# Patient Record
Sex: Male | Born: 1950 | Race: White | Hispanic: No | Marital: Married | Smoking: Former smoker
Health system: Southern US, Community
[De-identification: ages and names within clinical notes are randomized; demographics above are authoritative.]

## PROBLEM LIST (undated history)

## (undated) DIAGNOSIS — I10 Essential (primary) hypertension: Secondary | ICD-10-CM

## (undated) DIAGNOSIS — I714 Abdominal aortic aneurysm, without rupture, unspecified: Secondary | ICD-10-CM

## (undated) DIAGNOSIS — E785 Hyperlipidemia, unspecified: Secondary | ICD-10-CM

## (undated) HISTORY — PX: FOOT SURGERY: SHX648

---

## 2005-05-09 ENCOUNTER — Ambulatory Visit: Payer: Self-pay | Admitting: General Surgery

## 2005-05-27 ENCOUNTER — Ambulatory Visit: Payer: Self-pay | Admitting: Gastroenterology

## 2014-09-08 DIAGNOSIS — E559 Vitamin D deficiency, unspecified: Secondary | ICD-10-CM | POA: Insufficient documentation

## 2014-09-08 DIAGNOSIS — R7302 Impaired glucose tolerance (oral): Secondary | ICD-10-CM | POA: Insufficient documentation

## 2014-09-08 DIAGNOSIS — M79671 Pain in right foot: Secondary | ICD-10-CM | POA: Insufficient documentation

## 2014-09-08 DIAGNOSIS — N529 Male erectile dysfunction, unspecified: Secondary | ICD-10-CM | POA: Insufficient documentation

## 2014-09-08 DIAGNOSIS — E7800 Pure hypercholesterolemia, unspecified: Secondary | ICD-10-CM | POA: Insufficient documentation

## 2014-09-08 DIAGNOSIS — M79672 Pain in left foot: Secondary | ICD-10-CM

## 2016-07-18 ENCOUNTER — Ambulatory Visit (INDEPENDENT_AMBULATORY_CARE_PROVIDER_SITE_OTHER): Payer: BLUE CROSS/BLUE SHIELD

## 2016-07-18 ENCOUNTER — Encounter: Payer: Self-pay | Admitting: Podiatry

## 2016-07-18 ENCOUNTER — Ambulatory Visit (INDEPENDENT_AMBULATORY_CARE_PROVIDER_SITE_OTHER): Payer: BLUE CROSS/BLUE SHIELD | Admitting: Podiatry

## 2016-07-18 DIAGNOSIS — M79673 Pain in unspecified foot: Secondary | ICD-10-CM

## 2016-07-18 DIAGNOSIS — Q828 Other specified congenital malformations of skin: Secondary | ICD-10-CM | POA: Diagnosis not present

## 2016-07-18 DIAGNOSIS — M2012 Hallux valgus (acquired), left foot: Secondary | ICD-10-CM

## 2016-07-18 DIAGNOSIS — M6789 Other specified disorders of synovium and tendon, multiple sites: Secondary | ICD-10-CM | POA: Diagnosis not present

## 2016-07-18 DIAGNOSIS — M2042 Other hammer toe(s) (acquired), left foot: Secondary | ICD-10-CM | POA: Diagnosis not present

## 2016-07-18 DIAGNOSIS — M76829 Posterior tibial tendinitis, unspecified leg: Secondary | ICD-10-CM
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
## 2016-08-10 ENCOUNTER — Ambulatory Visit: Payer: BLUE CROSS/BLUE SHIELD

## 2016-08-26 ENCOUNTER — Telehealth: Payer: Self-pay | Admitting: *Deleted

## 2016-08-26 NOTE — Telephone Encounter (Signed)
Pt states he does not want to peruse MRI at this time, will call back next year. Informed Cari - Central Scheduling.

## 2017-12-01 ENCOUNTER — Other Ambulatory Visit
Admission: RE | Admit: 2017-12-01 | Discharge: 2017-12-01 | Disposition: A | Discharge status: Home / Self Care | Payer: BLUE CROSS/BLUE SHIELD | Source: Ambulatory Visit | Attending: *Deleted | Admitting: *Deleted

## 2017-12-01 ENCOUNTER — Encounter: Payer: Self-pay | Admitting: Emergency Medicine

## 2017-12-01 ENCOUNTER — Emergency Department
Admission: EM | Admit: 2017-12-01 | Discharge: 2017-12-01 | Disposition: A | Discharge status: Home / Self Care | Payer: BLUE CROSS/BLUE SHIELD | Attending: Emergency Medicine | Admitting: Emergency Medicine

## 2017-12-01 ENCOUNTER — Other Ambulatory Visit: Payer: Self-pay

## 2017-12-01 ENCOUNTER — Emergency Department: Payer: BLUE CROSS/BLUE SHIELD

## 2017-12-01 DIAGNOSIS — R791 Abnormal coagulation profile: Secondary | ICD-10-CM | POA: Insufficient documentation

## 2017-12-01 DIAGNOSIS — Z87891 Personal history of nicotine dependence: Secondary | ICD-10-CM | POA: Insufficient documentation

## 2017-12-01 DIAGNOSIS — R0600 Dyspnea, unspecified: Secondary | ICD-10-CM

## 2017-12-01 DIAGNOSIS — R2233 Localized swelling, mass and lump, upper limb, bilateral: Secondary | ICD-10-CM | POA: Insufficient documentation

## 2017-12-01 DIAGNOSIS — I10 Essential (primary) hypertension: Secondary | ICD-10-CM | POA: Insufficient documentation

## 2017-12-01 DIAGNOSIS — R600 Localized edema: Secondary | ICD-10-CM | POA: Insufficient documentation

## 2017-12-01 DIAGNOSIS — M25529 Pain in unspecified elbow: Secondary | ICD-10-CM | POA: Diagnosis not present

## 2017-12-01 DIAGNOSIS — R609 Edema, unspecified: Secondary | ICD-10-CM | POA: Diagnosis present

## 2017-12-01 DIAGNOSIS — Z79899 Other long term (current) drug therapy: Secondary | ICD-10-CM | POA: Diagnosis not present

## 2017-12-01 DIAGNOSIS — M25519 Pain in unspecified shoulder: Secondary | ICD-10-CM | POA: Diagnosis not present

## 2017-12-01 HISTORY — DX: Essential (primary) hypertension: I10

## 2017-12-01 HISTORY — DX: Hyperlipidemia, unspecified: E78.5

## 2017-12-01 LAB — TROPONIN I: Troponin I: <0.03 ng/mL (ref <0.03)

## 2017-12-01 LAB — BRAIN NATRIURETIC PEPTIDE: B NATRIURETIC PEPTIDE 5: 50 pg/mL (ref 0.0–100.0)

## 2017-12-01 LAB — FIBRIN DERIVATIVES D-DIMER (ARMC ONLY): Fibrin derivatives D-dimer (ARMC): 1499.1 ng/mL (FEU) — ABNORMAL HIGH (ref 0.00–499.00)

## 2017-12-01 MED ORDER — IOPAMIDOL (ISOVUE-370) INJECTION 76%
75.0000 mL | Freq: Once | INTRAVENOUS | Status: AC | PRN
  Administered 2017-12-01: 75 mL via INTRAVENOUS

## 2017-12-01 NOTE — ED Notes (Signed)
md into speak with pt

## 2017-12-01 NOTE — ED Provider Notes (Signed)
[facility] Emergency Department Provider Note  Time seen: 11:11 PM  I have reviewed the triage vital signs and the nursing notes.   HISTORY  Chief Complaint Leg Swelling and abnormal labs    HPI Javier Ramirez is a 67 y.o. male with a past medical history of hypertension, hyperlipidemia, went to his doctor today for increased peripheral edema including swelling in his hands, generalized joint pains and elevated blood pressure.  Patient was seen by his doctor had blood work performed, was discharged on prednisone in blood pressure medication.  He was called back later today saying that his d-dimer was elevated and that he needed to go to the emergency department for a CT scan of his chest to make sure he did not have a blood clot in his chest.  Patient denies any shortness of breath, denies any chest pain or pleuritic chest pain.  States he has had progressively worsening lower extremity edema over the past 1-2 weeks as well as edema and tenderness in his hands and pain in his joints especially shoulders and elbows.  Patient has been told that this is due to arthritis in the past.  Denies any known history of autoimmune disorders or rheumatoid arthritis.  Denies any fever.   Past Medical History:  Diagnosis Date  . Hyperlipemia   . Hypertension     Patient Active Problem List   Diagnosis Date Noted  . Bilateral foot pain 09/08/2014  . ED (erectile dysfunction) 09/08/2014  . Impaired glucose tolerance 09/08/2014  . Pure hypercholesterolemia 09/08/2014  . Vitamin D deficiency 09/08/2014     Prior to Admission medications   Medication Sig Start Date End Date Taking? Authorizing Provider  atorvastatin (LIPITOR) 10 MG tablet Take 10 mg by mouth daily.    Yes [provider]  Cholecalciferol (VITAMIN D3) 1000 units CAPS Take 1,000 Units by mouth daily.    Yes [provider]  ibuprofen (ADVIL,MOTRIN) 800 MG tablet Take 800 mg by mouth every 8  (eight) hours as needed for fever or moderate pain.  07/07/15  Yes [provider]  lisinopril-hydrochlorothiazide (PRINZIDE,ZESTORETIC) 20-12.5 MG tablet Take 1 tablet by mouth daily.   Yes [provider]  predniSONE (DELTASONE) 20 MG tablet Take 20 mg by mouth 2 (two) times daily.   Yes [provider]  sildenafil (REVATIO) 20 MG tablet Take 20 mg by mouth daily as needed.   Yes [provider]    No Known Allergies  No family history on file.  Social History Social History   Tobacco Use  . Smoking status: Former Games developer  . Smokeless tobacco: Never Used  Substance Use Topics  . Alcohol use: No    Frequency: Never  . Drug use: No    Review of Systems Constitutional: Negative for fever. Eyes: Negative for visual complaints ENT: Negative for recent illness/congestion Cardiovascular: Negative for chest pain. Respiratory: Negative for shortness of breath. Gastrointestinal: Negative for abdominal pain Genitourinary: Negative for urinary compaints Musculoskeletal: Positive for peripheral edema his lower extremities as well as hands.  Positive for joint pain especially in the shoulders and elbows. Skin: Negative for skin complaints  Neurological: Negative for headache All other ROS negative  ____________________________________________   PHYSICAL EXAM:  VITAL SIGNS: ED Triage Vitals [12/01/17 1850]  Enc Vitals Group     BP (!) 198/98     Pulse Rate 89     Resp 16     Temp 97.7 F (36.5 C)  Temp Source Oral     SpO2 97 %     Weight 231 lb (104.8 kg)     Height 5\' 9"  (1.753 m)     Head Circumference      Peak Flow      Pain Score 6     Pain Loc      Pain Edu?      Excl. in GC?    Constitutional: Alert and oriented. Well appearing and in no distress. Eyes: Normal exam ENT   Head: Normocephalic and atraumatic.   Mouth/Throat: Mucous membranes are moist. Cardiovascular: Normal rate, regular rhythm.  Respiratory: Normal  respiratory effort without tachypnea nor retractions. Breath sounds are clear  Gastrointestinal: Soft and nontender. No distention.   Musculoskeletal: Patient does have mild edema in both of his hands especially to the dorsal aspects, patient has tenderness and pain with range of motion mildly in bilateral shoulders and elbows.  States that has been going on for some time.  Patient has 1+ edema to his lower extremities, equal bilaterally without calf tenderness. Neurologic:  Normal speech and language. No gross focal neurologic deficits  Skin:  Skin is warm, dry and intact.  Psychiatric: Mood and affect are normal.   ____________________________________________   RADIOLOGY  CTA negative for PE.  Overall normal.  ____________________________________________   INITIAL IMPRESSION / ASSESSMENT AND PLAN / ED COURSE  Pertinent labs & imaging results that were available during my care of the patient were reviewed by me and considered in my medical decision making (see chart for details).  Patient presents the emergency department for an elevated d-dimer.  Differential would include inflammatory condition, PE.  We will obtain a CT scan as requested by the patient's physician.  Patient also states he saw his physician today for increased peripheral edema over the past 1 week.  Patient is noted to be quite hypertensive greater than 200/100 at his PCP office started on lisinopril/hydrochlorothiazide which I believe is an appropriate medication given his moderate edema and hypertension.  Should help reduce afterload as well as peripheral edema.  Has a follow-up appointment on Wednesday to recheck his pressures.  Patient was also started on prednisone which I believe is warranted given his hand edema to the dorsal aspect generalized joint pain not responsive to ibuprofen.  I discussed with the patient following up with his doctor to discuss possible autoimmune workup such as lupus or rheumatoid arthritis.   Patient CT scan is negative for blood clot.  Patient did not want the ultrasound as he said he was only sent for a CT scan.  I do not believe the patient requires an ultrasound given the very symmetrical peripheral edema.  No significant tenderness.  No shortness of breath, no chest pain.  We will discharge patient home with PCP follow-up.  Patient took his first dose of prednisone and lisinopril/hydrochlorthiazide in the emergency department.  ____________________________________________   FINAL CLINICAL IMPRESSION(S) / ED DIAGNOSES  Hypertension Peripheral edema    Minna AntisPaduchowski, Makela Niehoff, MD 12/01/17 2316

## 2017-12-01 NOTE — ED Triage Notes (Signed)
Pt to ED via POV, [facility] for bilateral UE and LE edema, pt had labs done and was told that his d-dimer was elevated and that he needed to come to ED for evaluation. Pt states that he has been taking a lot of ibuprofen and the doctor at walk in thought the swelling may be related to this and high blood pressure. Pt reports blood pressure at walk-in was 200/130, pt was given RX for blood pressure medication that is combined with a fluid pill and prednisone. Pt in NAD at this time.

## 2017-12-01 NOTE — ED Notes (Signed)
Approx this time Rosey Batheresa, US tech, informed this RN pt and family are refusing US and requesting CT

## 2017-12-01 NOTE — ED Notes (Signed)
Patient @ CT

## 2017-12-01 NOTE — ED Notes (Signed)
CT notified patient has IV placed.

## 2019-04-24 ENCOUNTER — Other Ambulatory Visit: Payer: Self-pay | Admitting: Internal Medicine

## 2019-04-24 DIAGNOSIS — G8929 Other chronic pain: Secondary | ICD-10-CM

## 2019-04-24 DIAGNOSIS — M5442 Lumbago with sciatica, left side: Secondary | ICD-10-CM

## 2019-05-03 ENCOUNTER — Ambulatory Visit
Admission: RE | Admit: 2019-05-03 | Discharge: 2019-05-03 | Disposition: A | Discharge status: Home / Self Care | Payer: BC Managed Care – PPO | Source: Ambulatory Visit | Attending: Internal Medicine | Admitting: Internal Medicine

## 2019-05-03 ENCOUNTER — Other Ambulatory Visit: Payer: Self-pay

## 2019-05-03 DIAGNOSIS — M5442 Lumbago with sciatica, left side: Secondary | ICD-10-CM | POA: Insufficient documentation

## 2019-05-03 DIAGNOSIS — G8929 Other chronic pain: Secondary | ICD-10-CM | POA: Insufficient documentation

## 2020-07-08 ENCOUNTER — Other Ambulatory Visit: Payer: Self-pay

## 2020-07-08 ENCOUNTER — Ambulatory Visit (INDEPENDENT_AMBULATORY_CARE_PROVIDER_SITE_OTHER): Payer: BC Managed Care – PPO

## 2020-07-08 ENCOUNTER — Ambulatory Visit
Admission: EM | Admit: 2020-07-08 | Discharge: 2020-07-08 | Disposition: A | Discharge status: Home / Self Care | Payer: BC Managed Care – PPO | Attending: Family Medicine | Admitting: Family Medicine

## 2020-07-08 DIAGNOSIS — U071 COVID-19: Secondary | ICD-10-CM | POA: Diagnosis not present

## 2020-07-08 DIAGNOSIS — I10 Essential (primary) hypertension: Secondary | ICD-10-CM | POA: Insufficient documentation

## 2020-07-08 DIAGNOSIS — J988 Other specified respiratory disorders: Secondary | ICD-10-CM | POA: Diagnosis not present

## 2020-07-08 DIAGNOSIS — R05 Cough: Secondary | ICD-10-CM | POA: Diagnosis present

## 2020-07-08 DIAGNOSIS — E785 Hyperlipidemia, unspecified: Secondary | ICD-10-CM | POA: Insufficient documentation

## 2020-07-08 DIAGNOSIS — Z7901 Long term (current) use of anticoagulants: Secondary | ICD-10-CM | POA: Insufficient documentation

## 2020-07-08 DIAGNOSIS — J069 Acute upper respiratory infection, unspecified: Secondary | ICD-10-CM | POA: Diagnosis not present

## 2020-07-08 DIAGNOSIS — R0981 Nasal congestion: Secondary | ICD-10-CM | POA: Diagnosis not present

## 2020-07-08 DIAGNOSIS — Z87891 Personal history of nicotine dependence: Secondary | ICD-10-CM | POA: Insufficient documentation

## 2020-07-08 DIAGNOSIS — E7800 Pure hypercholesterolemia, unspecified: Secondary | ICD-10-CM | POA: Diagnosis not present

## 2020-07-08 DIAGNOSIS — Z79899 Other long term (current) drug therapy: Secondary | ICD-10-CM | POA: Insufficient documentation

## 2020-07-08 DIAGNOSIS — B9789 Other viral agents as the cause of diseases classified elsewhere: Secondary | ICD-10-CM

## 2020-07-08 LAB — SARS CORONAVIRUS 2 (TAT 6-24 HRS): SARS Coronavirus 2: POSITIVE — AB

## 2020-07-08 MED ORDER — BENZONATATE 200 MG PO CAPS: 200.0000 mg | ORAL_CAPSULE | Freq: Three times a day (TID) | ORAL | 0 refills | Status: AC | PRN

## 2020-07-08 NOTE — ED Triage Notes (Signed)
Patient in today w/ c/o cough, sinus congestion and drainage, and chest congestion. Patient denies loss of taste or smell, B/A, H/A, or decreased appetite.

## 2020-07-08 NOTE — Discharge Instructions (Signed)
Rest.   Fluids.  Stay home.  Medications as prescribed.

## 2020-07-08 NOTE — ED Provider Notes (Signed)
[facility]    CSN: [id number] Arrival date & time: 07/08/20  1026      History   Chief Complaint Chief Complaint  Patient presents with  . Cough  . Nasal Congestion   HPI   69 year old male presents with the above complaints.  Patient reports that he has been sick for the past week.  Reports cough and congestion.  No fever.  Denies body aches.  Reports he feels.  No relieving factors.  Patient's wife is sick with symptoms as well.  Patient and his wife have not been vaccinating his COVID-19.  No other reported sick contacts.  No other associated symptoms.  No other complaints.  Past Medical History:  Diagnosis Date  . Hyperlipemia   . Hypertension     Patient Active Problem List   Diagnosis Date Noted  . Bilateral foot pain 09/08/2014  . ED (erectile dysfunction) 09/08/2014  . Impaired glucose tolerance 09/08/2014  . Pure hypercholesterolemia 09/08/2014  . Vitamin D deficiency 09/08/2014    Past Surgical History:  Procedure Laterality Date  . FOOT SURGERY Right        Home Medications    Prior to Admission medications   Medication Sig Start Date End Date Taking? Authorizing Provider  atorvastatin (LIPITOR) 10 MG tablet Take 10 mg by mouth daily.    Yes [provider]  Cholecalciferol (VITAMIN D3) 1000 units CAPS Take 1,000 Units by mouth daily.    Yes [provider]  HUMIRA PEN 40 MG/0.4ML PNKT SMARTSIG:40 Milligram(s) SUB-Q Every 2 Weeks 06/18/20  Yes [provider]  ibuprofen (ADVIL,MOTRIN) 800 MG tablet Take 800 mg by mouth every 8 (eight) hours as needed for fever or moderate pain.  07/07/15  Yes [provider]  lisinopril-hydrochlorothiazide (PRINZIDE,ZESTORETIC) 20-12.5 MG tablet Take 1 tablet by mouth daily.   Yes [provider]  methotrexate 2.5 MG tablet  06/29/20  Yes [provider]  sildenafil (REVATIO) 20 MG tablet Take 20 mg by mouth daily as needed.   Yes [provider]    benzonatate (TESSALON) 200 MG capsule Take 1 capsule (200 mg total) by mouth 3 (three) times daily as needed for cough. 07/08/20   Tommie Sams, DO    Social History Social History   Tobacco Use  . Smoking status: Former Games developer  . Smokeless tobacco: Never Used  Substance Use Topics  . Alcohol use: No  . Drug use: No     Allergies   Patient has no known allergies.   Review of Systems Review of Systems  Constitutional: Negative for fever.  HENT: Positive for congestion.   Respiratory: Positive for cough.    Physical Exam Triage Vital Signs ED Triage Vitals  Enc Vitals Group     BP 07/08/20 1109 130/74     Pulse Rate 07/08/20 1109 69     Resp 07/08/20 1109 18     Temp 07/08/20 1109 98 F (36.7 C)     Temp Source 07/08/20 1109 Oral     SpO2 07/08/20 1109 96 %     Weight 07/08/20 1110 218 lb (98.9 kg)     Height 07/08/20 1110 5\' 9"  (1.753 m)     Head Circumference --      Peak Flow --      Pain Score 07/08/20 1109 0     Pain Loc --      Pain Edu? --      Excl. in GC? --    Updated Vital  Signs BP 130/74 (BP Location: Right Arm)   Pulse 69   Temp 98 F (36.7 C) (Oral)   Resp 18   Ht 5\' 9"  (1.753 m)   Wt 98.9 kg   SpO2 96%   BMI 32.19 kg/m   Visual Acuity Right Eye Distance:   Left Eye Distance:   Bilateral Distance:    Right Eye Near:   Left Eye Near:    Bilateral Near:     Physical Exam Vitals and nursing note reviewed.  Constitutional:      General: He is not in acute distress.    Appearance: Normal appearance. He is not ill-appearing.  HENT:     Head: Normocephalic and atraumatic.  Eyes:     General:        Right eye: No discharge.        Left eye: No discharge.     Conjunctiva/sclera: Conjunctivae normal.  Cardiovascular:     Rate and Rhythm: Normal rate and regular rhythm.     Heart sounds: No murmur heard.   Pulmonary:     Effort: Pulmonary effort is normal.     Breath sounds: Normal breath sounds. No wheezing or rales.   Neurological:     Mental Status: He is alert.  Psychiatric:        Mood and Affect: Mood normal.        Behavior: Behavior normal.    UC Treatments / Results  Labs (all labs ordered are listed, but only abnormal results are displayed) Labs Reviewed  SARS CORONAVIRUS 2 (TAT 6-24 HRS)    EKG   Radiology DG Chest 2 View  Result Date: 07/08/2020 CLINICAL DATA:  Cough EXAM: CHEST - 2 VIEW COMPARISON:  None. FINDINGS: The heart size and mediastinal contours are within normal limits. Both lungs are clear. The visualized skeletal structures are unremarkable. IMPRESSION: No acute abnormality of the lungs. Electronically Signed   By: 09/07/2020 M.D.   On: 07/08/2020 12:08    Procedures Procedures (including critical care time)  Medications Ordered in UC Medications - No data to display  Initial Impression / Assessment and Plan / UC Course  I have reviewed the triage vital signs and the nursing notes.  Pertinent labs & imaging results that were available during my care of the patient were reviewed by me and considered in my medical decision making (see chart for details).    70 year old male presents with a viral respiratory infection.  Suspected COVID-19.  Awaiting test result.  Chest x-ray obtained and independently reviewed by me.  Interpretation: No acute infiltrate.  Normal chest film.  Advised rest and fluids and supportive care.  Tessalon Perles for cough.  If Covid test is positive, recommend monoclonal antibody infusion.  Final Clinical Impressions(s) / UC Diagnoses   Final diagnoses:  Viral respiratory infection     Discharge Instructions     Rest.   Fluids.  Stay home.  Medications as prescribed.    ED Prescriptions    Medication Sig Dispense Auth. Provider   benzonatate (TESSALON) 200 MG capsule Take 1 capsule (200 mg total) by mouth 3 (three) times daily as needed for cough. 30 capsule 78, DO     PDMP not reviewed this encounter.   Tommie Sams, DO 07/08/20 1226

## 2020-07-09 ENCOUNTER — Telehealth: Payer: Self-pay | Admitting: Oncology

## 2020-07-09 ENCOUNTER — Other Ambulatory Visit: Payer: Self-pay | Admitting: Oncology

## 2020-07-09 ENCOUNTER — Telehealth: Payer: Self-pay | Admitting: Family

## 2020-07-09 DIAGNOSIS — U071 COVID-19: Secondary | ICD-10-CM

## 2020-07-09 NOTE — Telephone Encounter (Signed)
I connected by phone with  Mr. Rutigliano  to discuss the potential use of an new treatment for mild to moderate COVID-19 viral infection in non-hospitalized patients.   This patient is a age/sex that meets the FDA criteria for Emergency Use Authorization of casirivimab\imdevimab.  Has a (+) direct SARS-CoV-2 viral test result 1. Has mild or moderate COVID-19  2. Is ? 70 years of age and weighs ? 40 kg 3. Is NOT hospitalized due to COVID-19 4. Is NOT requiring oxygen therapy or requiring an increase in baseline oxygen flow rate due to COVID-19 5. Is within 10 days of symptom onset 6. Has at least one of the high risk factor(s) for progression to severe COVID-19 and/or hospitalization as defined in EUA. ? Specific high risk criteria : age   Symptom onset 07/03/20   I have spoken and communicated the following to the patient or parent/caregiver:   1. FDA has authorized the emergency use of casirivimab\imdevimab for the treatment of mild to moderate COVID-19 in adults and pediatric patients with positive results of direct SARS-CoV-2 viral testing who are 63 years of age and older weighing at least 40 kg, and who are at high risk for progressing to severe COVID-19 and/or hospitalization.   2. The significant known and potential risks and benefits of casirivimab\imdevimab, and the extent to which such potential risks and benefits are unknown.   3. Information on available alternative treatments and the risks and benefits of those alternatives, including clinical trials.   4. Patients treated with casirivimab\imdevimab should continue to self-isolate and use infection control measures (e.g., wear mask, isolate, social distance, avoid sharing personal items, clean and disinfect "high touch" surfaces, and frequent handwashing) according to CDC guidelines.    5. The patient or parent/caregiver has the option to accept or refuse casirivimab\imdevimab .   After reviewing this information with the  patient, The patient agreed to proceed with receiving casirivimab\imdevimab infusion and will be provided a copy of the Fact sheet prior to receiving the infusion.Mignon Pine, AGNP-C [phone] (Infusion Center Hotline)

## 2020-07-09 NOTE — Telephone Encounter (Signed)
Called to Discuss with patient about Covid symptoms and the use of the monoclonal antibody infusion for those with mild to moderate Covid symptoms and at a high risk of hospitalization.     Pt appears to qualify for this infusion due to co-morbid conditions and/or a member of an at-risk group in accordance with the FDA Emergency Use Authorization.    Unable to reach Mr. Stockham who was recently seen at [facility] with cough and congestion and found to have positive COVID testing. Risk factors include BMI and hypertension.   Left message on voicemail. No MyChart available.   Marcos Eke, NP 07/09/2020 4:09 PM

## 2020-07-10 ENCOUNTER — Ambulatory Visit (HOSPITAL_COMMUNITY)
Admission: RE | Admit: 2020-07-10 | Discharge: 2020-07-10 | Disposition: A | Discharge status: Home / Self Care | Payer: BC Managed Care – PPO | Source: Ambulatory Visit | Attending: Pulmonary Disease | Admitting: Pulmonary Disease

## 2020-07-10 DIAGNOSIS — U071 COVID-19: Secondary | ICD-10-CM | POA: Diagnosis not present

## 2020-07-10 MED ORDER — DIPHENHYDRAMINE HCL 50 MG/ML IJ SOLN: 50.0000 mg | Freq: Once | INTRAMUSCULAR | Status: DC | PRN

## 2020-07-10 MED ORDER — EPINEPHRINE 0.3 MG/0.3ML IJ SOAJ: 0.3000 mg | Freq: Once | INTRAMUSCULAR | Status: DC | PRN

## 2020-07-10 MED ORDER — METHYLPREDNISOLONE SODIUM SUCC 125 MG IJ SOLR: 125.0000 mg | Freq: Once | INTRAMUSCULAR | Status: DC | PRN

## 2020-07-10 MED ORDER — ALBUTEROL SULFATE HFA 108 (90 BASE) MCG/ACT IN AERS: 2.0000 | INHALATION_SPRAY | Freq: Once | RESPIRATORY_TRACT | Status: DC | PRN

## 2020-07-10 MED ORDER — SODIUM CHLORIDE 0.9 % IV SOLN: INTRAVENOUS | Status: DC | PRN

## 2020-07-10 MED ORDER — FAMOTIDINE IN NACL 20-0.9 MG/50ML-% IV SOLN: 20.0000 mg | Freq: Once | INTRAVENOUS | Status: DC | PRN

## 2020-07-10 MED ORDER — CASIRIVIMAB-IMDEVIMAB 600-600 MG/10ML IJ SOLN
1200.0000 mg | Freq: Once | INTRAMUSCULAR | Status: AC
  Administered 2020-07-10: 1200 mg via INTRAVENOUS
  Filled 2020-07-10: qty 10

## 2020-07-10 NOTE — Discharge Instructions (Signed)

## 2020-07-10 NOTE — Progress Notes (Signed)
  Diagnosis: COVID-19  Physician: Dr. Patrick Wright  Procedure: Covid Infusion Clinic Med: casirivimab\imdevimab infusion - Provided patient with casirivimab\imdevimab fact sheet for patients, parents and caregivers prior to infusion.  Complications: No immediate complications noted.  Discharge: Discharged home   Ally Yow 07/10/2020   

## 2021-03-29 ENCOUNTER — Other Ambulatory Visit: Payer: Self-pay

## 2021-03-29 ENCOUNTER — Other Ambulatory Visit: Payer: Self-pay | Admitting: Family Medicine

## 2021-03-29 ENCOUNTER — Ambulatory Visit
Admission: RE | Admit: 2021-03-29 | Discharge: 2021-03-29 | Disposition: A | Discharge status: Home / Self Care | Payer: BC Managed Care – PPO | Source: Ambulatory Visit | Attending: Family Medicine | Admitting: Family Medicine

## 2021-03-29 DIAGNOSIS — M7989 Other specified soft tissue disorders: Secondary | ICD-10-CM | POA: Insufficient documentation

## 2021-03-29 DIAGNOSIS — M79661 Pain in right lower leg: Secondary | ICD-10-CM

## 2022-04-28 IMAGING — CR DG CHEST 2V
2 series · 2 of 2 positions shown · non-contrast
Comparison: None.

CLINICAL DATA: Cough

EXAM:
CHEST - 2 VIEW

[chest pa]
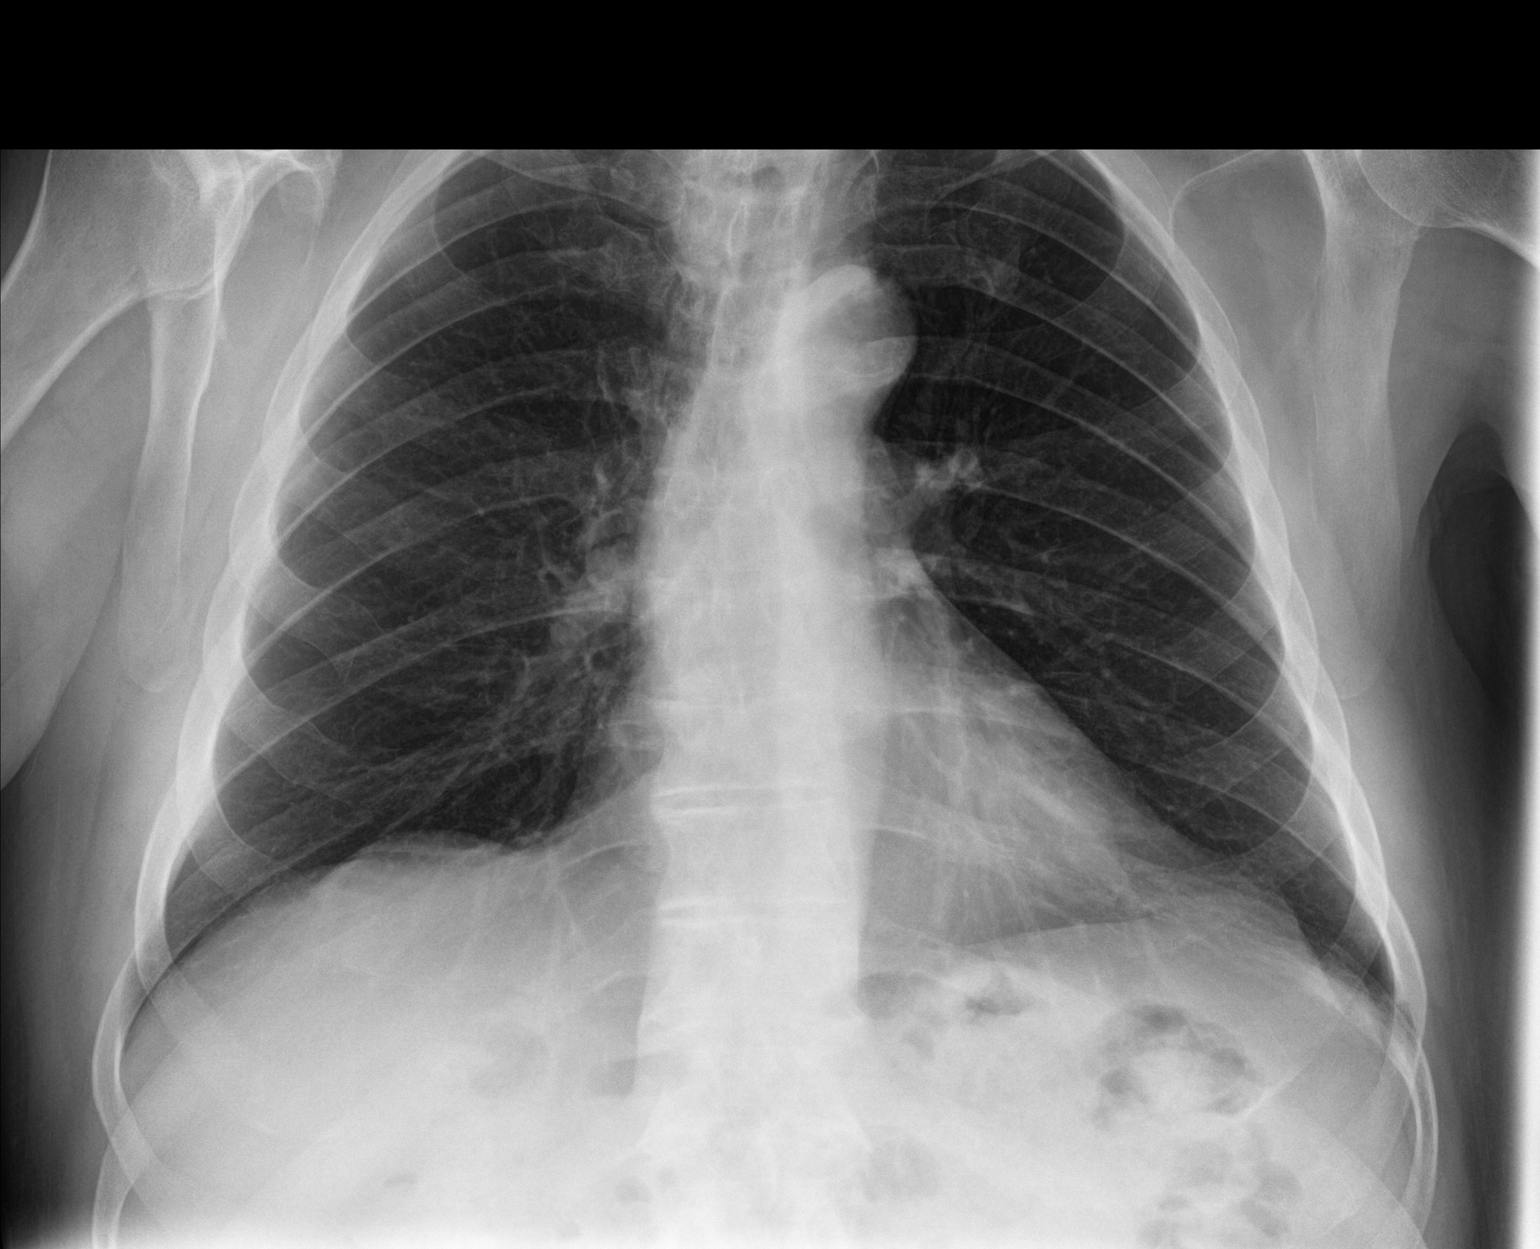

[chest lat]
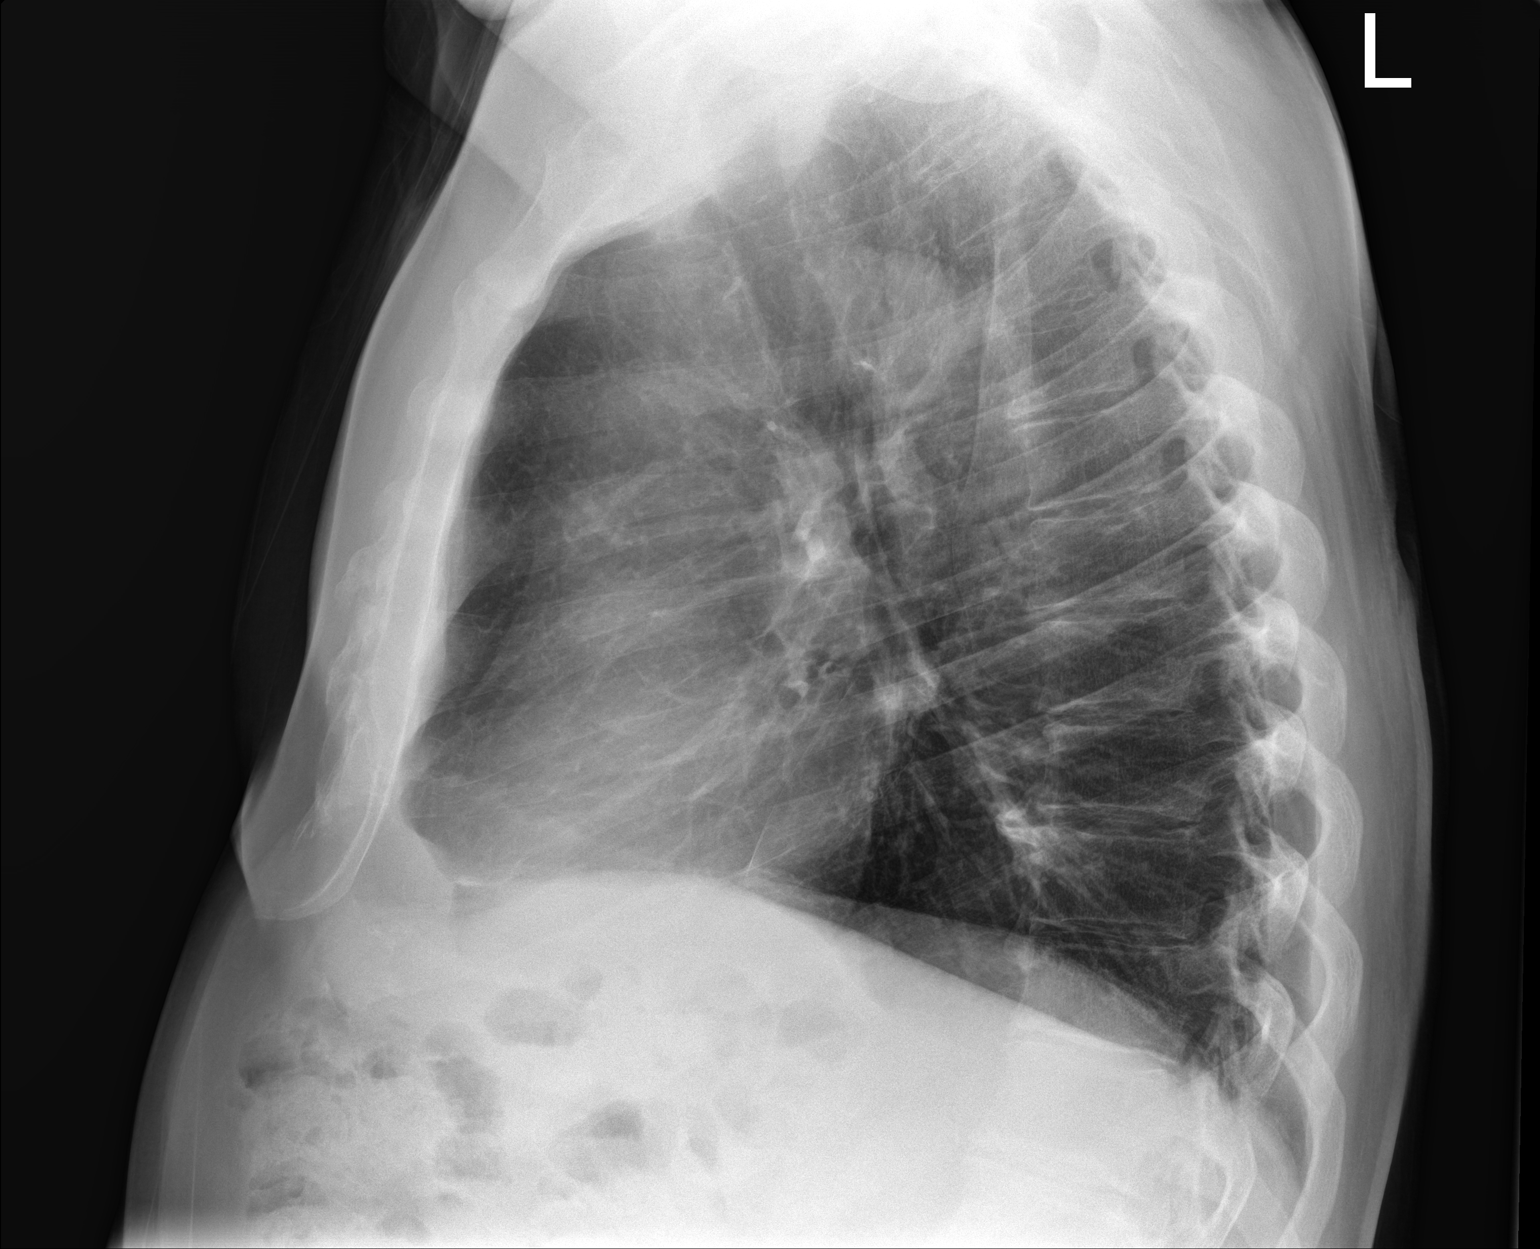

[2 of 2 positions shown; findings below may reference images not displayed]

FINDINGS: The heart size and mediastinal contours are within normal limits.
Both lungs are clear. The visualized skeletal structures are
unremarkable.
IMPRESSION: No acute abnormality of the lungs.

## 2022-05-05 ENCOUNTER — Other Ambulatory Visit: Payer: Self-pay | Admitting: Rheumatology

## 2022-05-05 DIAGNOSIS — Z8616 Personal history of COVID-19: Secondary | ICD-10-CM

## 2022-05-05 DIAGNOSIS — M059 Rheumatoid arthritis with rheumatoid factor, unspecified: Secondary | ICD-10-CM

## 2022-05-13 ENCOUNTER — Ambulatory Visit
Admission: RE | Admit: 2022-05-13 | Discharge: 2022-05-13 | Disposition: A | Discharge status: Home / Self Care | Payer: Medicare Other | Source: Ambulatory Visit | Attending: Rheumatology | Admitting: Rheumatology

## 2022-05-13 DIAGNOSIS — Z8616 Personal history of COVID-19: Secondary | ICD-10-CM

## 2022-05-13 DIAGNOSIS — M059 Rheumatoid arthritis with rheumatoid factor, unspecified: Secondary | ICD-10-CM

## 2022-09-26 ENCOUNTER — Ambulatory Visit: Payer: Medicare Other | Attending: Otolaryngology | Admitting: Speech Pathology

## 2022-09-26 ENCOUNTER — Encounter: Payer: Self-pay | Admitting: Speech Pathology

## 2022-09-26 DIAGNOSIS — R490 Dysphonia: Secondary | ICD-10-CM | POA: Diagnosis not present

## 2022-09-26 NOTE — Therapy (Signed)
OUTPATIENT SPEECH LANGUAGE PATHOLOGY VOICE EVALUATION   Patient Name: Javier Ramirez MRN: [id number] DOB:Mar 20, 1951, 71 y.o., male Today's Date: 09/26/2022  PCP: Maudie Flakes, MD REFERRING PROVIDER: Vernie Murders, MD   End of Session - 09/26/22 1255     Visit Number 1    Number of Visits 17    Date for SLP Re-Evaluation 11/21/22    Authorization Type Medicare A/ Medicare B    Progress Note Due on Visit 10    SLP Start Time 1300    SLP Stop Time  1400    SLP Time Calculation (min) 60 min    Activity Tolerance Patient tolerated treatment well             Past Medical History:  Diagnosis Date   Hyperlipemia    Hypertension    Past Surgical History:  Procedure Laterality Date   FOOT SURGERY Right    Patient Active Problem List   Diagnosis Date Noted   Bilateral foot pain 09/08/2014   ED (erectile dysfunction) 09/08/2014   Impaired glucose tolerance 09/08/2014   Pure hypercholesterolemia 09/08/2014   Vitamin D deficiency 09/08/2014    Onset date: 09/06/2022 date of referral ; chronic for 3 years Dysphonia  REFERRING DIAG: Dysphonia  THERAPY DIAG:  Dysphonia  Rationale for Evaluation and Treatment Rehabilitation  SUBJECTIVE:   SUBJECTIVE STATEMENT: Pt pleasant, eager, motivated, wife provides most of pt's history Pt accompanied by: significant other  PERTINENT HISTORY:  Pt is a 71 year old male who has been seen by pulmonology, cardiology, rhemotology and ENT for chronic cough x 3 years, more noticeable post COVID 2000. Pt's chest CT was negative, pt has completed Medrol dose pack and has an Albutrol Inhaler to use prn for coughing. He was also taken off of his ace inhibitor with no resolution of pt's coughing.  DIAGNOSTIC FINDINGS:   Most recently (09/06/2022)  pt was evaluated by ENT (Dr Vernie Murders).  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 No/   FALLS: Has patient fallen in last 6 months? No,  Number of falls: N/A  LIVING ENVIRONMENT: Lives with: lives with their spouse Lives in: House/apartment  PLOF: Independent  PATIENT GOALS to get rid of his cough  OBJECTIVE:   COGNITION: Overall cognitive status: Within functional limits for tasks assessed  SOCIAL HISTORY: Occupation: retired < 1 year Water intake: suboptimal Caffeine/alcohol intake: moderate Daily voice use: minimal Environmental risks: None reported Occupational risks: None identified Misuse: Excessively high pitch, Glottal fry, Strain, Tension, Speaks without adequate warm-up, Speaks without adequate breath support, and Speaks on residual capacity Phonotraumatic behaviors: Excessive and/or habitual throat clearing  PERCEPTUAL VOICE ASSESSMENT: Voice quality: hoarse, breathy, strained, low vocal intensity, diplophonia, and vocal fatigue Vocal abuse: habitual throat clearing, abnormal breathing pattern, and habitual abnormal pitch Resonance: normal Respiratory function: speaking on residual capacity  OBJECTIVE VOICE ASSESSMENT: Sustained "ah" maximum phonation time: 9 seconds Sustained "ah" loudness average: 74 dB Average fundamental frequency during sustained "ah":191 Hz   (2 SD above average of  145 Hz +/- 23 for gender)  Oral reading (passage) loudness average: 69 dB Oral reading loudness range: 12 dB Conversational pitch average: 183 Hz Highest dynamic pitch in conversational speech: 205 Hz Lowest dynamic pitch in conversational speech: 151 Hz Conversational pitch range: 54 Hz Conversational loudness average: 67 dB Conversational loudness range: 12 dB S/z ratio: .79 (Suggestive of dysfunction >1.0) Voice quality: hoarse, strained, low vocal intensity, and diplophonia  ORAL MOTOR EXAMINATION Facial : WFL Lingual: WFL Velum: WFL Mandible: WFL Cough: WFL Voice: Hoarse, Strained, Breathy    PATIENT REPORTED OUTCOME MEASURES (PROM):  VOICE HANDICAP INDEX (VHI)  The Voice Handicap  Index is comprised of a series of questions to assess the patient's perception of their voice. It is designed to evaluate the emotional, physical and functional components of the voice problem.  Functional: 19 Physical: 18 Emotional: 10 Total: 30 (Normal mean 8.75, SD =14.97)  z score =  1.41 mild = 1.01-1.99  TODAY'S TREATMENT:   N/A   PATIENT EDUCATION: Education details: results of this assessment and ST POC Person educated: Patient and Spouse Education method: Explanation Education comprehension: verbalized understanding and needs further education   HOME EXERCISE PROGRAM: Increase hydration     GOALS: Goals reviewed with patient? Yes  SHORT TERM GOALS: Target date: 10 sessions  The patient will maximize voice quality and loudness using breath support/oral resonance for sustained vowel production, pitch glides, and hierarchal speech drill. Baseline: Goal status: INITIAL  2.  The patient will eliminate phonotraumatic behaviors such as chronic throat clearing, by substituting non-traumatic methods to clear mucus. Baseline:  Goal status: INITIAL   LONG TERM GOALS: Target date: 11/21/2022  Patient will report improved communication effectiveness as measured by Communicative Effectiveness Survey.  Baseline:  Goal status: INITIAL  2.  The patient will demonstrate independent understanding of vocal hygiene concepts. Baseline:  Goal status: INITIAL  ASSESSMENT:  CLINICAL IMPRESSION: Patient is a 71 y.o. male who was seen today for a qualitative voice evaluation.    OBJECTIVE IMPAIRMENTS include voice disorder. These impairments are limiting patient from effectively communicating at home and in community. Factors affecting potential to achieve goals and functional outcome are  ~ 3 year chronic nature of deficits .Marland Kitchen Patient will benefit from skilled SLP services to address above impairments and improve overall function.  REHAB POTENTIAL: Good  PLAN: SLP  FREQUENCY: 1-2x/week  SLP DURATION: 8 weeks  PLANNED INTERVENTIONS: SLP instruction and feedback and Patient/family education    Ellaina Schuler B. Dreama Saa, M.S., CCC-SLP, Tree surgeon Certified Brain Injury Specialist [facility]  [facility] Office [phone] Ascom [phone] Fax [phone]

## 2022-10-03 ENCOUNTER — Ambulatory Visit: Payer: Medicare Other | Admitting: Speech Pathology

## 2022-10-06 ENCOUNTER — Ambulatory Visit: Payer: Medicare Other | Admitting: Speech Pathology

## 2022-10-11 ENCOUNTER — Ambulatory Visit: Payer: Medicare Other | Admitting: Speech Pathology

## 2022-10-14 ENCOUNTER — Ambulatory Visit: Payer: Medicare Other | Admitting: Speech Pathology

## 2022-10-18 ENCOUNTER — Ambulatory Visit: Payer: Medicare Other | Admitting: Speech Pathology

## 2022-10-20 ENCOUNTER — Encounter: Payer: Medicare Other | Admitting: Speech Pathology

## 2022-10-25 ENCOUNTER — Encounter: Payer: Medicare Other | Admitting: Speech Pathology

## 2022-10-27 ENCOUNTER — Encounter: Payer: Medicare Other | Admitting: Speech Pathology

## 2022-11-03 ENCOUNTER — Encounter: Payer: Medicare Other | Admitting: Speech Pathology

## 2022-11-08 ENCOUNTER — Encounter: Payer: Medicare Other | Admitting: Speech Pathology

## 2022-11-15 ENCOUNTER — Encounter: Payer: Medicare Other | Admitting: Speech Pathology

## 2022-11-17 ENCOUNTER — Encounter: Payer: Medicare Other | Admitting: Speech Pathology

## 2022-11-22 ENCOUNTER — Encounter: Payer: Medicare Other | Admitting: Speech Pathology

## 2022-11-24 ENCOUNTER — Encounter: Payer: Medicare Other | Admitting: Speech Pathology

## 2022-11-29 ENCOUNTER — Encounter: Payer: Medicare Other | Admitting: Speech Pathology

## 2022-11-29 ENCOUNTER — Other Ambulatory Visit: Payer: Self-pay

## 2022-11-29 DIAGNOSIS — I7121 Aneurysm of the ascending aorta, without rupture: Secondary | ICD-10-CM

## 2022-12-01 ENCOUNTER — Encounter: Payer: Medicare Other | Admitting: Speech Pathology

## 2022-12-06 ENCOUNTER — Encounter: Payer: Medicare Other | Admitting: Speech Pathology

## 2022-12-07 ENCOUNTER — Other Ambulatory Visit: Payer: Self-pay | Admitting: Family Medicine

## 2022-12-07 DIAGNOSIS — I7121 Aneurysm of the ascending aorta, without rupture: Secondary | ICD-10-CM

## 2022-12-08 ENCOUNTER — Encounter: Payer: Medicare Other | Admitting: Speech Pathology

## 2022-12-13 ENCOUNTER — Encounter: Payer: Medicare Other | Admitting: Speech Pathology

## 2022-12-15 ENCOUNTER — Ambulatory Visit
Admission: RE | Admit: 2022-12-15 | Discharge: 2022-12-15 | Disposition: A | Discharge status: Home / Self Care | Payer: Medicare Other | Source: Ambulatory Visit | Attending: Family Medicine | Admitting: Family Medicine

## 2022-12-15 ENCOUNTER — Encounter: Payer: Medicare Other | Admitting: Speech Pathology

## 2022-12-15 DIAGNOSIS — I7121 Aneurysm of the ascending aorta, without rupture: Secondary | ICD-10-CM

## 2022-12-15 MED ORDER — IOPAMIDOL (ISOVUE-370) INJECTION 76%
75.0000 mL | Freq: Once | INTRAVENOUS | Status: AC | PRN
  Administered 2022-12-15: 75 mL via INTRAVENOUS

## 2022-12-20 ENCOUNTER — Encounter: Payer: Medicare Other | Admitting: Speech Pathology

## 2022-12-22 ENCOUNTER — Encounter: Payer: Medicare Other | Admitting: Speech Pathology

## 2022-12-27 ENCOUNTER — Encounter: Payer: Medicare Other | Admitting: Speech Pathology

## 2022-12-29 ENCOUNTER — Encounter: Payer: Medicare Other | Admitting: Speech Pathology

## 2023-01-03 ENCOUNTER — Encounter: Payer: Medicare Other | Admitting: Speech Pathology

## 2023-01-05 ENCOUNTER — Encounter: Payer: Medicare Other | Admitting: Speech Pathology

## 2023-01-10 ENCOUNTER — Encounter: Payer: Medicare Other | Admitting: Speech Pathology

## 2023-01-12 ENCOUNTER — Encounter: Payer: Medicare Other | Admitting: Speech Pathology

## 2023-01-17 ENCOUNTER — Encounter: Payer: Medicare Other | Admitting: Speech Pathology

## 2023-01-17 IMAGING — US US EXTREM LOW VENOUS*R*
1 series · 13 of 24 positions shown · non-contrast
Comparison: None.

CLINICAL DATA: Right leg pain and swelling for 2 days



[Series 1: us venous img lower uni right (dvt) · portal-venous · 13 of 35 slices shown]
[im 1/35]
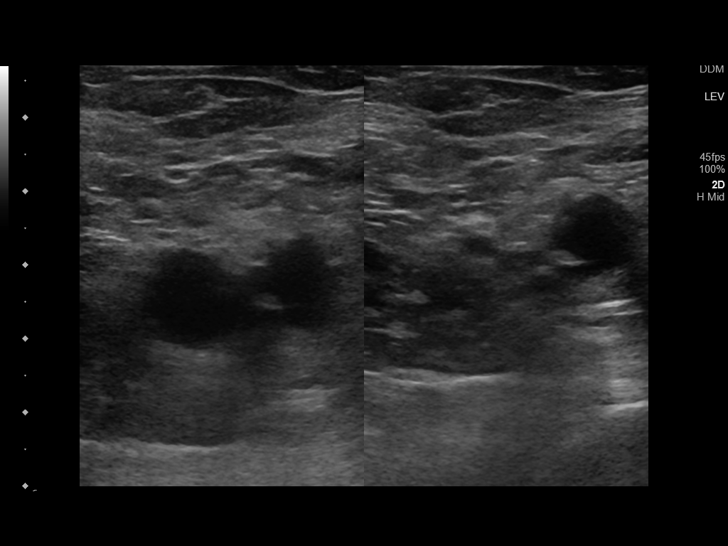
[im 3/35]
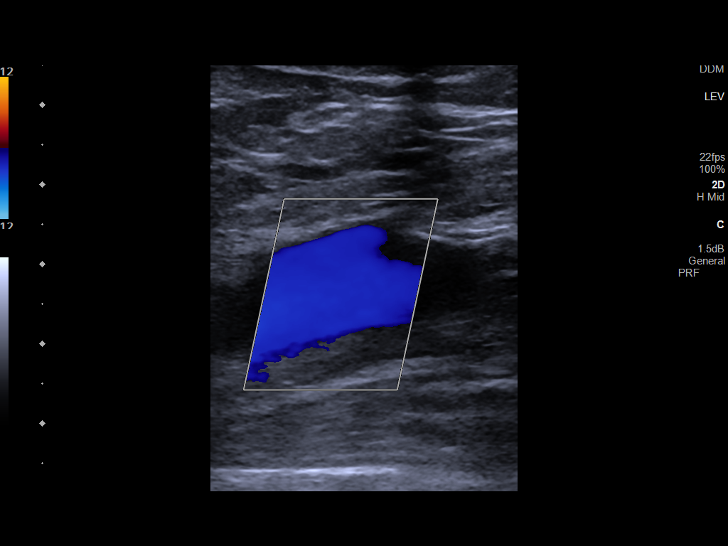
[im 6/35]
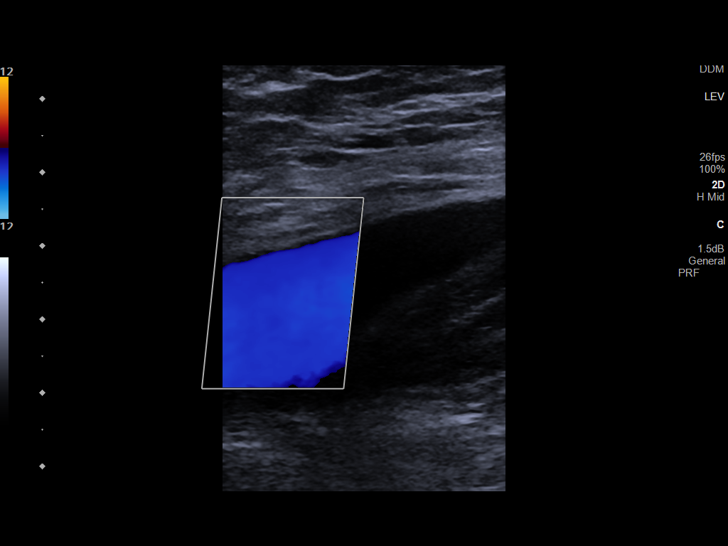
[im 9/35]
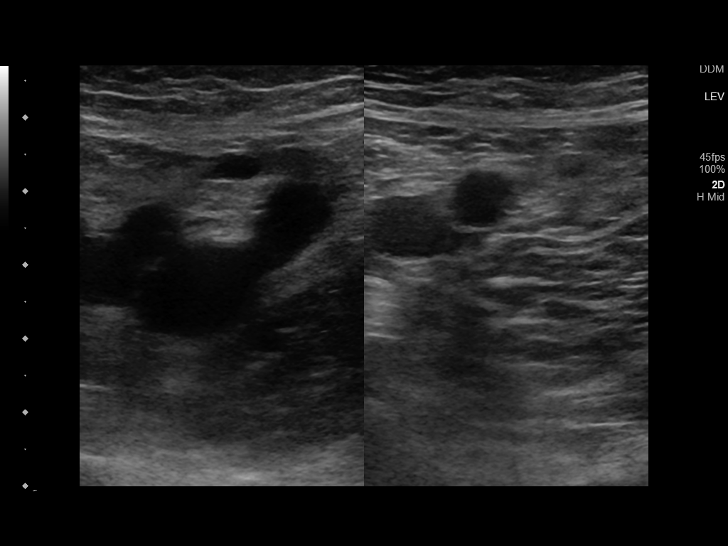
[im 12/35]
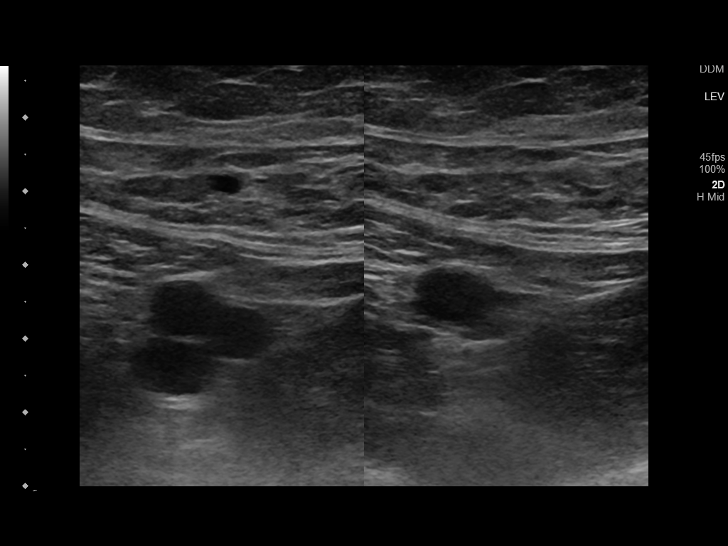
[im 15/35]
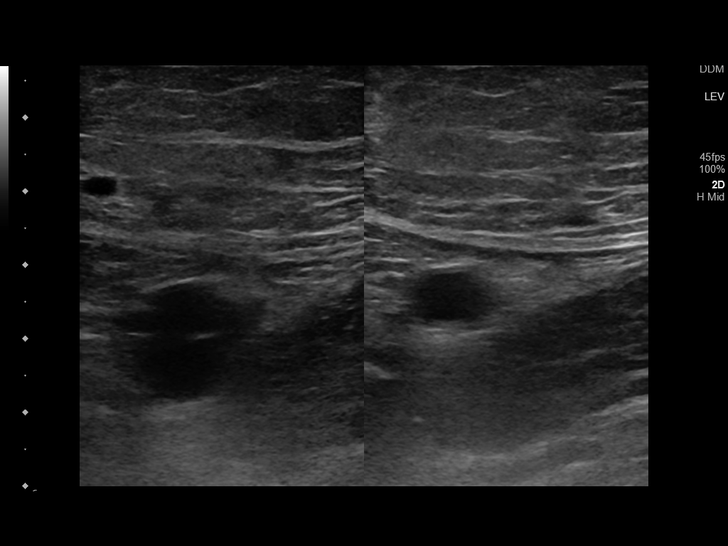
[im 18/35]
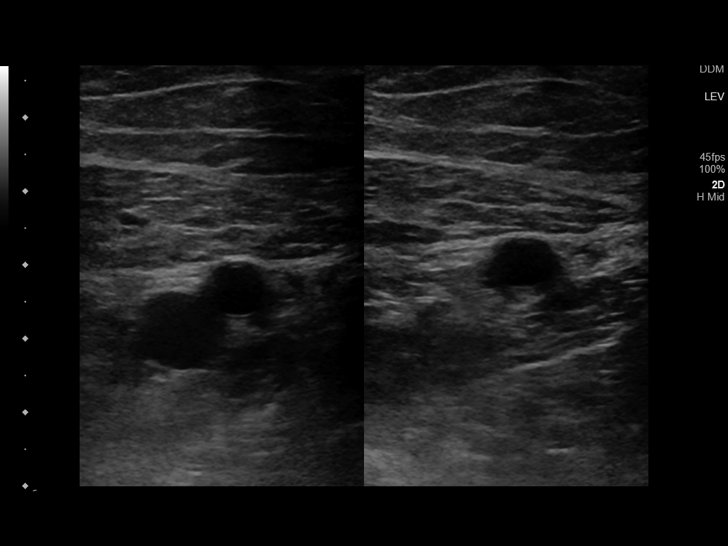
[im 20/35]
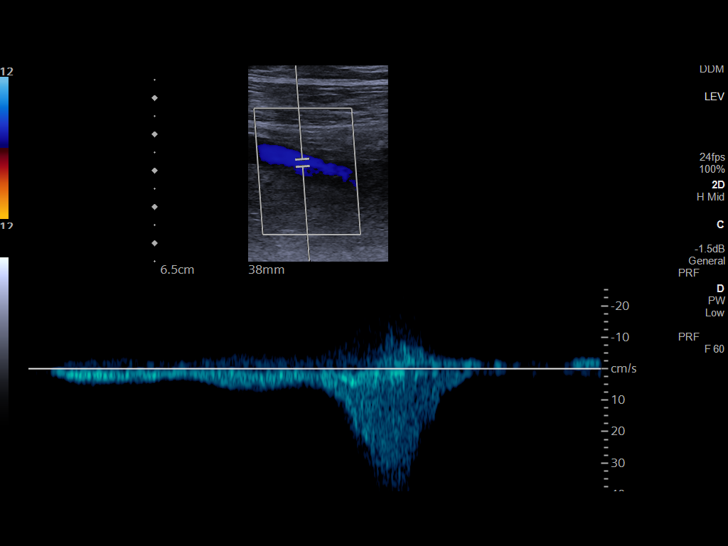
[im 23/35]
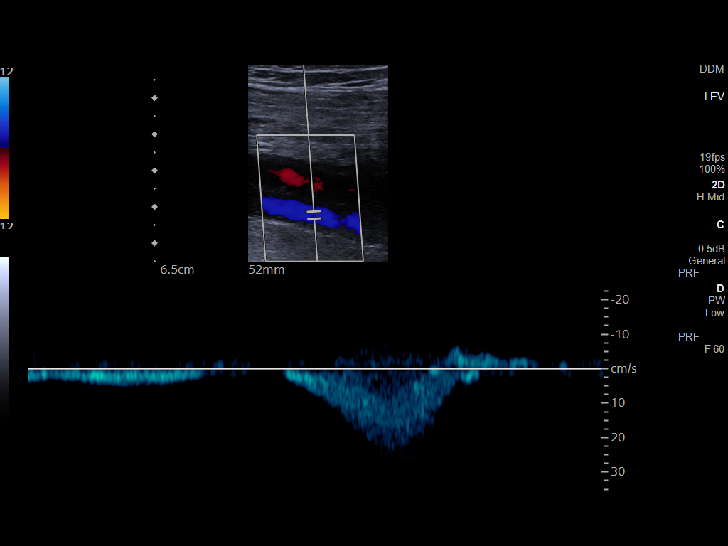
[im 26/35]
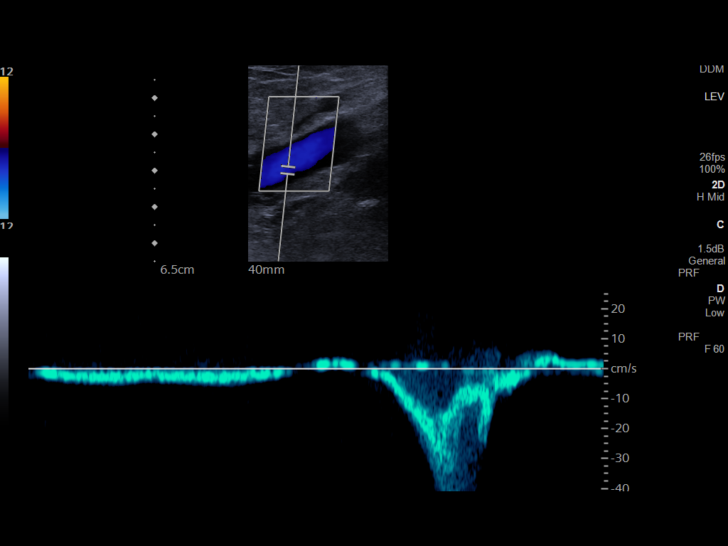
[im 29/35]
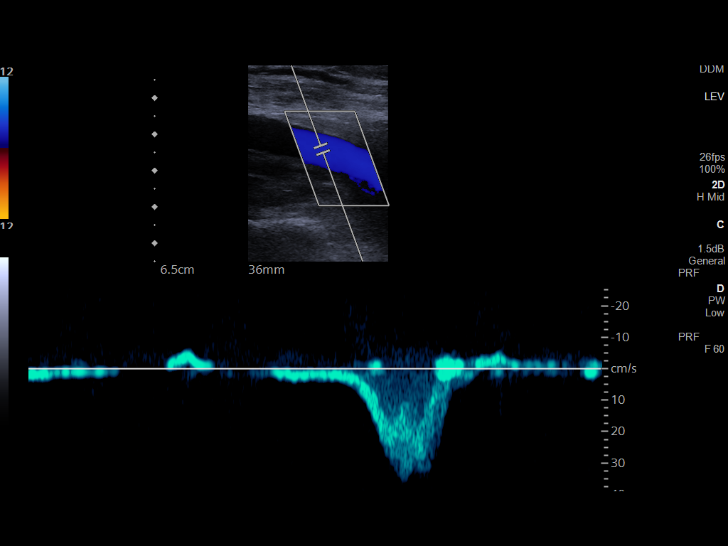
[im 32/35]
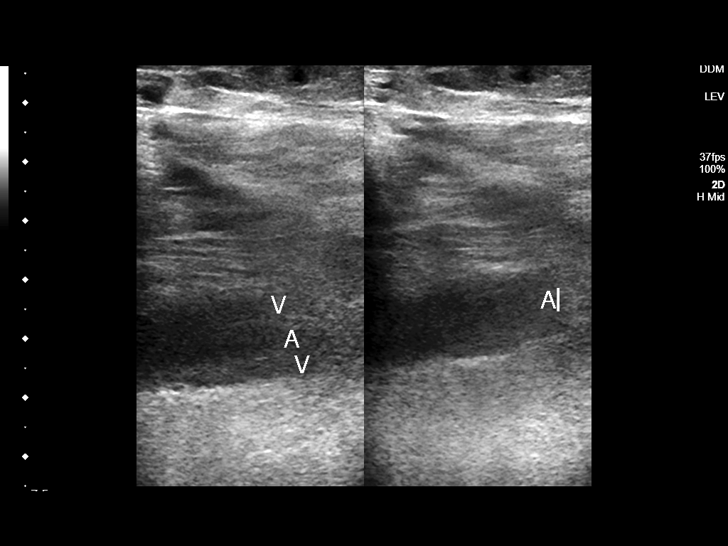
[im 35/35]
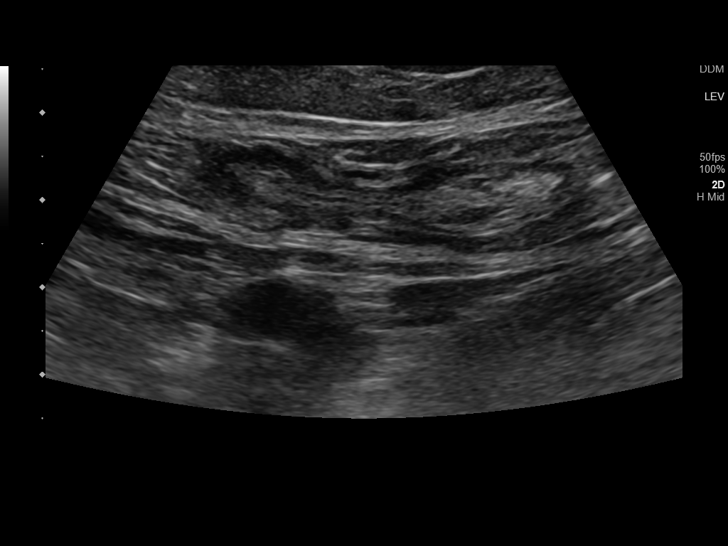

[13 of 24 positions shown; findings below may reference images not displayed]

FINDINGS: Contralateral Common Femoral Vein: Respiratory phasicity is normal
and symmetric with the symptomatic side. No evidence of thrombus.
Normal compressibility.

Common Femoral Vein: No evidence of thrombus. Normal
compressibility, respiratory phasicity and response to augmentation.

Saphenofemoral Junction: No evidence of thrombus. Normal
compressibility and flow on color Doppler imaging.

Profunda Femoral Vein: No evidence of thrombus. Normal
compressibility and flow on color Doppler imaging.

Femoral Vein: No evidence of thrombus. Normal compressibility,
respiratory phasicity and response to augmentation.

Popliteal Vein: No evidence of thrombus. Normal compressibility,
respiratory phasicity and response to augmentation.

Calf Veins: No evidence of thrombus. Normal compressibility and flow
on color Doppler imaging.

Superficial Great Saphenous Vein: No evidence of thrombus. Normal
compressibility.

Venous Reflux:  None.

Other Findings:  None.
IMPRESSION: No evidence of deep venous thrombosis.

These results will be called to the ordering clinician or
representative by the [HOSPITAL] at the imaging location.

## 2023-01-19 ENCOUNTER — Encounter: Payer: Medicare Other | Admitting: Speech Pathology

## 2023-01-24 ENCOUNTER — Encounter: Payer: Medicare Other | Admitting: Speech Pathology

## 2023-01-26 ENCOUNTER — Encounter: Payer: Medicare Other | Admitting: Speech Pathology

## 2023-01-31 ENCOUNTER — Encounter: Payer: Medicare Other | Admitting: Speech Pathology

## 2023-02-02 ENCOUNTER — Encounter: Payer: Medicare Other | Admitting: Speech Pathology

## 2023-02-07 ENCOUNTER — Encounter: Payer: Medicare Other | Admitting: Speech Pathology

## 2023-02-09 ENCOUNTER — Encounter: Payer: Medicare Other | Admitting: Speech Pathology

## 2023-02-14 ENCOUNTER — Encounter: Payer: Medicare Other | Admitting: Speech Pathology

## 2023-02-16 ENCOUNTER — Encounter: Payer: Medicare Other | Admitting: Speech Pathology

## 2023-02-21 ENCOUNTER — Encounter: Payer: Medicare Other | Admitting: Speech Pathology

## 2023-02-23 ENCOUNTER — Encounter: Payer: Medicare Other | Admitting: Speech Pathology

## 2023-07-14 ENCOUNTER — Other Ambulatory Visit (INDEPENDENT_AMBULATORY_CARE_PROVIDER_SITE_OTHER): Payer: Self-pay | Admitting: Family Medicine

## 2023-07-14 DIAGNOSIS — R29898 Other symptoms and signs involving the musculoskeletal system: Secondary | ICD-10-CM

## 2023-07-14 DIAGNOSIS — I10 Essential (primary) hypertension: Secondary | ICD-10-CM

## 2023-08-09 ENCOUNTER — Ambulatory Visit (INDEPENDENT_AMBULATORY_CARE_PROVIDER_SITE_OTHER): Payer: Medicare Other

## 2023-08-09 DIAGNOSIS — R29898 Other symptoms and signs involving the musculoskeletal system: Secondary | ICD-10-CM

## 2023-08-09 DIAGNOSIS — I10 Essential (primary) hypertension: Secondary | ICD-10-CM

## 2023-11-24 ENCOUNTER — Other Ambulatory Visit: Payer: Self-pay | Admitting: Family Medicine

## 2023-11-24 DIAGNOSIS — I7121 Aneurysm of the ascending aorta, without rupture: Secondary | ICD-10-CM

## 2023-11-30 ENCOUNTER — Ambulatory Visit
Admission: RE | Admit: 2023-11-30 | Discharge: 2023-11-30 | Disposition: A | Discharge status: Home / Self Care | Payer: Medicare Other | Source: Ambulatory Visit | Attending: Family Medicine | Admitting: Family Medicine

## 2023-11-30 DIAGNOSIS — I7121 Aneurysm of the ascending aorta, without rupture: Secondary | ICD-10-CM | POA: Insufficient documentation

## 2023-11-30 MED ORDER — IOHEXOL 350 MG/ML SOLN
100.0000 mL | Freq: Once | INTRAVENOUS | Status: AC | PRN
  Administered 2023-11-30: 100 mL via INTRAVENOUS

## 2024-09-02 ENCOUNTER — Encounter
Admission: RE | Disposition: A | Discharge status: Home / Self Care | Payer: Self-pay | Source: Home / Self Care | Attending: Gastroenterology

## 2024-09-02 ENCOUNTER — Encounter: Payer: Self-pay | Admitting: Gastroenterology

## 2024-09-02 ENCOUNTER — Ambulatory Visit: Admitting: General Practice

## 2024-09-02 ENCOUNTER — Ambulatory Visit
Admission: RE | Admit: 2024-09-02 | Discharge: 2024-09-02 | Disposition: A | Discharge status: Home / Self Care | Attending: Gastroenterology | Admitting: Gastroenterology

## 2024-09-02 DIAGNOSIS — K5730 Diverticulosis of large intestine without perforation or abscess without bleeding: Secondary | ICD-10-CM | POA: Insufficient documentation

## 2024-09-02 DIAGNOSIS — Z87891 Personal history of nicotine dependence: Secondary | ICD-10-CM | POA: Insufficient documentation

## 2024-09-02 DIAGNOSIS — D123 Benign neoplasm of transverse colon: Secondary | ICD-10-CM | POA: Diagnosis not present

## 2024-09-02 DIAGNOSIS — Z83719 Family history of colon polyps, unspecified: Secondary | ICD-10-CM | POA: Diagnosis present

## 2024-09-02 DIAGNOSIS — D120 Benign neoplasm of cecum: Secondary | ICD-10-CM | POA: Diagnosis not present

## 2024-09-02 DIAGNOSIS — I10 Essential (primary) hypertension: Secondary | ICD-10-CM | POA: Insufficient documentation

## 2024-09-02 DIAGNOSIS — I714 Abdominal aortic aneurysm, without rupture, unspecified: Secondary | ICD-10-CM | POA: Insufficient documentation

## 2024-09-02 DIAGNOSIS — Z1211 Encounter for screening for malignant neoplasm of colon: Secondary | ICD-10-CM | POA: Insufficient documentation

## 2024-09-02 HISTORY — DX: Abdominal aortic aneurysm, without rupture, unspecified: I71.40

## 2024-09-02 SURGERY — COLONOSCOPY
Anesthesia: General

## 2024-09-02 MED ORDER — SODIUM CHLORIDE 0.9 % IV SOLN
INTRAVENOUS | Status: DC
  Administered 2024-09-02: 500 mL via INTRAVENOUS

## 2024-09-02 MED ORDER — PHENYLEPHRINE 80 MCG/ML (10ML) SYRINGE FOR IV PUSH (FOR BLOOD PRESSURE SUPPORT)
PREFILLED_SYRINGE | INTRAVENOUS | Status: DC | PRN
  Administered 2024-09-02: 160 ug via INTRAVENOUS

## 2024-09-02 MED ORDER — PROPOFOL 10 MG/ML IV BOLUS
INTRAVENOUS | Status: DC | PRN
  Administered 2024-09-02: 70 mg via INTRAVENOUS
  Administered 2024-09-02: 20 mg via INTRAVENOUS

## 2024-09-02 MED ORDER — PROPOFOL 500 MG/50ML IV EMUL
INTRAVENOUS | Status: DC | PRN
  Administered 2024-09-02: 150 ug/kg/min via INTRAVENOUS

## 2024-09-02 MED ORDER — LIDOCAINE HCL (CARDIAC) PF 100 MG/5ML IV SOSY
PREFILLED_SYRINGE | INTRAVENOUS | Status: DC | PRN
  Administered 2024-09-02: 50 mg via INTRAVENOUS

## 2024-09-02 NOTE — Op Note (Signed)
 [facility] Patient Name: Javier Ramirez Procedure Date: 09/02/2024 10:29 AM MRN: [id number] Account #: [id number] Date of Birth: December 09, 1950 Admit Type: Outpatient Age: 73 Room: [facility] ENDO ROOM 1 Gender: Male Note Status: Finalized Instrument Name: Colon Scope [id number] Procedure:             Colonoscopy Indications:           Screening for colorectal malignant neoplasm, Colon                         cancer screening in patient at increased risk: Family                         history of 1st-degree relative with colon polyps Providers:             Ruel Kung MD, MD Referring MD:          Cheryl CHARLENA Jericho (Referring MD) Medicines:             Monitored Anesthesia Care Complications:         No immediate complications. Procedure:             Pre-Anesthesia Assessment:                        - Prior to the procedure, a History and Physical was                         performed, and patient medications, allergies and                         sensitivities were reviewed. The patient's tolerance                         of previous anesthesia was reviewed.                        - The risks and benefits of the procedure and the                         sedation options and risks were discussed with the                         patient. All questions were answered and informed                         consent was obtained.                        - ASA Grade Assessment: II - A patient with mild                         systemic disease.                        After obtaining informed consent, the colonoscope was                         passed under direct vision. Throughout the procedure,  the patient's blood pressure, pulse, and oxygen                         saturations were monitored continuously. The                         Colonoscope was introduced through the anus and                         advanced to the the cecum, identified  by the                         appendiceal orifice. The colonoscopy was performed                         with ease. The patient tolerated the procedure well.                         The quality of the bowel preparation was good. The                         ileocecal valve, appendiceal orifice, and rectum were                         photographed. Findings:      The perianal and digital rectal examinations were normal.      An 8 mm polyp was found in the cecum. The polyp was sessile. The polyp       was removed with a cold snare. Resection and retrieval were complete. To       prevent bleeding post-intervention, one hemostatic clip was successfully       placed. Clip manufacturer: [employer]. There was no bleeding at       the end of the procedure.      A 3 mm polyp was found in the cecum. The polyp was sessile. The polyp       was removed with a jumbo cold forceps. Resection and retrieval were       complete.      A 5 mm polyp was found in the transverse colon. The polyp was sessile.       The polyp was removed with a cold snare. Resection and retrieval were       complete.      Multiple medium-mouthed diverticula were found in the left colon.      The exam was otherwise without abnormality on direct and retroflexion       views. Impression:            - One 8 mm polyp in the cecum, removed with a cold                         snare. Resected and retrieved. Clip was placed. Clip                         manufacturer: [employer].                        - One 3 mm polyp in the cecum, removed with a jumbo  cold forceps. Resected and retrieved.                        - One 5 mm polyp in the transverse colon, removed with                         a cold snare. Resected and retrieved.                        - Diverticulosis in the left colon.                        - The examination was otherwise normal on direct and                         retroflexion  views. Recommendation:        - Discharge patient to home (with escort).                        - Resume previous diet.                        - Continue present medications.                        - Await pathology results.                        - Repeat colonoscopy for surveillance. Procedure Code(s):     --- Professional ---                        [id number], Colonoscopy, flexible; with removal of                         tumor(s), polyp(s), or other lesion(s) by snare                         technique                        45380, 59, Colonoscopy, flexible; with biopsy, single                         or multiple Diagnosis Code(s):     --- Professional ---                        Z12.11, Encounter for screening for malignant neoplasm                         of colon                        Z83.71, Family history of colonic polyps                        D12.0, Benign neoplasm of cecum                        D12.3, Benign neoplasm of transverse colon (hepatic  flexure or splenic flexure)                        K57.30, Diverticulosis of large intestine without                         perforation or abscess without bleeding CPT copyright 2022 American Medical Association. All rights reserved. The codes documented in this report are preliminary and upon coder review may  be revised to meet current compliance requirements. Ruel Kung, MD Ruel Kung MD, MD 09/02/2024 11:00:16 AM This report has been signed electronically. Number of Addenda: 0 Note Initiated On: 09/02/2024 10:29 AM Scope Withdrawal Time: 0 hours 13 minutes 54 seconds  Total Procedure Duration: 0 hours 16 minutes 8 seconds  Estimated Blood Loss:  Estimated blood loss: none.      [facility]

## 2024-09-02 NOTE — Transfer of Care (Signed)
 Immediate Anesthesia Transfer of Care Note  Patient: Javier Ramirez  Procedure(s) Performed: COLONOSCOPY POLYPECTOMY, INTESTINE  Patient Location: Endoscopy Unit  Anesthesia Type:General  Level of Consciousness: drowsy and patient cooperative  Airway & Oxygen Therapy: Patient Spontanous Breathing and Patient connected to face mask oxygen  Post-op Assessment: Report given to RN, Post -op Vital signs reviewed and stable, and Patient moving all extremities X 4  Post vital signs: Reviewed and stable  Last Vitals:  Vitals Value Taken Time  BP 107/67 09/02/24 11:07  Temp 35.7 C 09/02/24 11:01  Pulse 70 09/02/24 11:07  Resp 18 09/02/24 11:07  SpO2 100 % 09/02/24 11:07    Last Pain:  Vitals:   09/02/24 1102  TempSrc:   PainSc: 0-No pain         Complications: No notable events documented.
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
## 2024-09-02 NOTE — Anesthesia Postprocedure Evaluation (Signed)
 Anesthesia Post Note  Patient: Javier Ramirez  Procedure(s) Performed: COLONOSCOPY POLYPECTOMY, INTESTINE  Patient location during evaluation: Endoscopy Anesthesia Type: General Level of consciousness: awake and alert Pain management: pain level controlled Vital Signs Assessment: post-procedure vital signs reviewed and stable Respiratory status: spontaneous breathing, nonlabored ventilation, respiratory function stable and patient connected to nasal cannula oxygen Cardiovascular status: blood pressure returned to baseline and stable Postop Assessment: no apparent nausea or vomiting Anesthetic complications: no   No notable events documented.   Last Vitals:  Vitals:   09/02/24 1107 09/02/24 1112  BP: 107/67 101/68  Pulse: 70 72  Resp: 18 12  Temp:    SpO2: 100% 100%    Last Pain:  Vitals:   09/02/24 1112  TempSrc:   PainSc: 0-No pain                 Debby Mines
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
## 2024-09-03 LAB — SURGICAL PATHOLOGY

## 2024-11-11 DIAGNOSIS — I7121 Aneurysm of the ascending aorta, without rupture: Secondary | ICD-10-CM

## 2024-11-27 ENCOUNTER — Ambulatory Visit
Admission: RE | Admit: 2024-11-27 | Discharge: 2024-11-27 | Disposition: A | Discharge status: Home / Self Care | Source: Ambulatory Visit | Attending: Family Medicine | Admitting: Family Medicine

## 2024-11-27 DIAGNOSIS — I7121 Aneurysm of the ascending aorta, without rupture: Secondary | ICD-10-CM | POA: Insufficient documentation

## 2024-11-27 MED ORDER — IOHEXOL 350 MG/ML SOLN
100.0000 mL | Freq: Once | INTRAVENOUS | Status: AC | PRN
  Administered 2024-11-27: 100 mL via INTRAVENOUS
